# Patient Record
Sex: Female | Born: 1990 | Hispanic: No | Marital: Single | Smoking: Never smoker
Health system: Southern US, Community
[De-identification: ages and names within clinical notes are randomized; demographics above are authoritative.]

## PROBLEM LIST (undated history)

## (undated) HISTORY — PX: ABDOMINAL HYSTERECTOMY: SHX81

## (undated) HISTORY — PX: TUBAL LIGATION: SHX77

---

## 2016-09-29 ENCOUNTER — Other Ambulatory Visit (HOSPITAL_COMMUNITY)
Admission: RE | Admit: 2016-09-29 | Discharge: 2016-09-29 | Disposition: A | Discharge status: Home / Self Care | Payer: Self-pay | Source: Ambulatory Visit | Attending: Unknown Physician Specialty | Admitting: Unknown Physician Specialty

## 2016-09-29 DIAGNOSIS — N870 Mild cervical dysplasia: Secondary | ICD-10-CM | POA: Insufficient documentation

## 2019-02-27 ENCOUNTER — Other Ambulatory Visit: Payer: Self-pay

## 2019-02-27 ENCOUNTER — Ambulatory Visit (INDEPENDENT_AMBULATORY_CARE_PROVIDER_SITE_OTHER): Payer: Medicaid Other | Admitting: Otolaryngology

## 2019-02-27 DIAGNOSIS — D2322 Other benign neoplasm of skin of left ear and external auricular canal: Secondary | ICD-10-CM | POA: Diagnosis not present

## 2019-03-01 ENCOUNTER — Other Ambulatory Visit: Payer: Self-pay | Admitting: Otolaryngology

## 2019-03-14 ENCOUNTER — Other Ambulatory Visit: Payer: Self-pay

## 2019-03-14 ENCOUNTER — Encounter (HOSPITAL_BASED_OUTPATIENT_CLINIC_OR_DEPARTMENT_OTHER): Payer: Self-pay | Admitting: *Deleted

## 2019-03-17 ENCOUNTER — Other Ambulatory Visit (HOSPITAL_COMMUNITY)
Admission: RE | Admit: 2019-03-17 | Discharge: 2019-03-17 | Disposition: A | Discharge status: Home / Self Care | Payer: Medicaid Other | Source: Ambulatory Visit | Attending: Otolaryngology | Admitting: Otolaryngology

## 2019-03-17 ENCOUNTER — Other Ambulatory Visit (HOSPITAL_COMMUNITY): Payer: Medicaid Other

## 2019-03-17 ENCOUNTER — Other Ambulatory Visit: Payer: Self-pay

## 2019-03-17 DIAGNOSIS — Z01812 Encounter for preprocedural laboratory examination: Secondary | ICD-10-CM | POA: Insufficient documentation

## 2019-03-17 DIAGNOSIS — Z20828 Contact with and (suspected) exposure to other viral communicable diseases: Secondary | ICD-10-CM | POA: Diagnosis not present

## 2019-03-17 LAB — SARS CORONAVIRUS 2 (TAT 6-24 HRS): SARS Coronavirus 2: NEGATIVE

## 2019-03-21 ENCOUNTER — Ambulatory Visit (HOSPITAL_BASED_OUTPATIENT_CLINIC_OR_DEPARTMENT_OTHER)
Admission: RE | Admit: 2019-03-21 | Discharge: 2019-03-21 | Disposition: A | Discharge status: Home / Self Care | Payer: Medicaid Other | Attending: Otolaryngology | Admitting: Otolaryngology

## 2019-03-21 ENCOUNTER — Other Ambulatory Visit: Payer: Self-pay

## 2019-03-21 ENCOUNTER — Ambulatory Visit (HOSPITAL_BASED_OUTPATIENT_CLINIC_OR_DEPARTMENT_OTHER): Payer: Medicaid Other | Admitting: Certified Registered"

## 2019-03-21 ENCOUNTER — Encounter (HOSPITAL_BASED_OUTPATIENT_CLINIC_OR_DEPARTMENT_OTHER): Payer: Self-pay

## 2019-03-21 ENCOUNTER — Encounter (HOSPITAL_BASED_OUTPATIENT_CLINIC_OR_DEPARTMENT_OTHER)
Admission: RE | Disposition: A | Discharge status: Home / Self Care | Payer: Self-pay | Source: Home / Self Care | Attending: Otolaryngology

## 2019-03-21 DIAGNOSIS — H938X2 Other specified disorders of left ear: Secondary | ICD-10-CM | POA: Diagnosis present

## 2019-03-21 DIAGNOSIS — G43909 Migraine, unspecified, not intractable, without status migrainosus: Secondary | ICD-10-CM | POA: Insufficient documentation

## 2019-03-21 DIAGNOSIS — D2322 Other benign neoplasm of skin of left ear and external auricular canal: Secondary | ICD-10-CM

## 2019-03-21 DIAGNOSIS — D2222 Melanocytic nevi of left ear and external auricular canal: Secondary | ICD-10-CM | POA: Diagnosis not present

## 2019-03-21 HISTORY — PX: MASS EXCISION: SHX2000

## 2019-03-21 LAB — POCT PREGNANCY, URINE: Preg Test, Ur: NEGATIVE

## 2019-03-21 SURGERY — EXCISION MASS
Anesthesia: General | Site: Ear | Laterality: Left

## 2019-03-21 MED ORDER — CIPROFLOXACIN-FLUOCINOLONE PF 0.3-0.025 % OT SOLN
OTIC | Status: AC
  Filled 2019-03-21: qty 0.25

## 2019-03-21 MED ORDER — HYDROCODONE-ACETAMINOPHEN 5-325 MG PO TABS: 1.0000 | ORAL_TABLET | Freq: Four times a day (QID) | ORAL | 0 refills | Status: AC | PRN

## 2019-03-21 MED ORDER — DEXAMETHASONE SODIUM PHOSPHATE 4 MG/ML IJ SOLN
INTRAMUSCULAR | Status: DC | PRN
  Administered 2019-03-21: 10 mg via INTRAVENOUS

## 2019-03-21 MED ORDER — MIDAZOLAM HCL 2 MG/2ML IJ SOLN
INTRAMUSCULAR | Status: AC
  Filled 2019-03-21: qty 2

## 2019-03-21 MED ORDER — ONDANSETRON HCL 4 MG/2ML IJ SOLN
INTRAMUSCULAR | Status: DC | PRN
  Administered 2019-03-21: 4 mg via INTRAVENOUS

## 2019-03-21 MED ORDER — PROPOFOL 10 MG/ML IV BOLUS
INTRAVENOUS | Status: DC | PRN
  Administered 2019-03-21: 100 mg via INTRAVENOUS
  Administered 2019-03-21: 200 mg via INTRAVENOUS

## 2019-03-21 MED ORDER — LACTATED RINGERS IV SOLN: INTRAVENOUS | Status: DC

## 2019-03-21 MED ORDER — LACTATED RINGERS IV SOLN
INTRAVENOUS | Status: DC
  Administered 2019-03-21: 07:00:00 via INTRAVENOUS

## 2019-03-21 MED ORDER — LIDOCAINE 2% (20 MG/ML) 5 ML SYRINGE
INTRAMUSCULAR | Status: DC | PRN
  Administered 2019-03-21: 60 mg via INTRAVENOUS

## 2019-03-21 MED ORDER — AMOXICILLIN 875 MG PO TABS: 875.0000 mg | ORAL_TABLET | Freq: Two times a day (BID) | ORAL | 0 refills | Status: AC

## 2019-03-21 MED ORDER — LIDOCAINE-EPINEPHRINE 1 %-1:100000 IJ SOLN
INTRAMUSCULAR | Status: AC
  Filled 2019-03-21: qty 2

## 2019-03-21 MED ORDER — PROPOFOL 500 MG/50ML IV EMUL
INTRAVENOUS | Status: AC
  Filled 2019-03-21: qty 50

## 2019-03-21 MED ORDER — LIDOCAINE-EPINEPHRINE 1 %-1:100000 IJ SOLN
INTRAMUSCULAR | Status: DC | PRN
  Administered 2019-03-21: 1 mL

## 2019-03-21 MED ORDER — FENTANYL CITRATE (PF) 100 MCG/2ML IJ SOLN
INTRAMUSCULAR | Status: AC
  Filled 2019-03-21: qty 2

## 2019-03-21 MED ORDER — MEPERIDINE HCL 25 MG/ML IJ SOLN: 6.2500 mg | INTRAMUSCULAR | Status: DC | PRN

## 2019-03-21 MED ORDER — CEFAZOLIN SODIUM-DEXTROSE 2-3 GM-%(50ML) IV SOLR
INTRAVENOUS | Status: DC | PRN
  Administered 2019-03-21: 2 g via INTRAVENOUS

## 2019-03-21 MED ORDER — BACITRACIN ZINC 500 UNIT/GM EX OINT
TOPICAL_OINTMENT | CUTANEOUS | Status: AC
  Filled 2019-03-21: qty 0.9

## 2019-03-21 MED ORDER — EPINEPHRINE PF 1 MG/ML IJ SOLN
INTRAMUSCULAR | Status: AC
  Filled 2019-03-21: qty 1

## 2019-03-21 MED ORDER — MIDAZOLAM HCL 5 MG/5ML IJ SOLN
INTRAMUSCULAR | Status: DC | PRN
  Administered 2019-03-21: 2 mg via INTRAVENOUS

## 2019-03-21 MED ORDER — FENTANYL CITRATE (PF) 100 MCG/2ML IJ SOLN: 25.0000 ug | INTRAMUSCULAR | Status: DC | PRN

## 2019-03-21 MED ORDER — FENTANYL CITRATE (PF) 100 MCG/2ML IJ SOLN
INTRAMUSCULAR | Status: DC | PRN
  Administered 2019-03-21 (×2): 50 ug via INTRAVENOUS

## 2019-03-21 MED ORDER — SUCCINYLCHOLINE CHLORIDE 200 MG/10ML IV SOSY
PREFILLED_SYRINGE | INTRAVENOUS | Status: DC | PRN
  Administered 2019-03-21: 100 mg via INTRAVENOUS

## 2019-03-21 MED ORDER — METOCLOPRAMIDE HCL 5 MG/ML IJ SOLN: 10.0000 mg | Freq: Once | INTRAMUSCULAR | Status: DC | PRN

## 2019-03-21 SURGICAL SUPPLY — 51 items
APPLICATOR COTTON TIP 6 STRL (MISCELLANEOUS) IMPLANT
APPLICATOR COTTON TIP 6IN STRL (MISCELLANEOUS)
BLADE SURG 15 STRL LF DISP TIS (BLADE) ×1 IMPLANT
BLADE SURG 15 STRL SS (BLADE) ×2
CANISTER SUCT 1200ML W/VALVE (MISCELLANEOUS) ×3 IMPLANT
COTTONBALL LRG STERILE PKG (GAUZE/BANDAGES/DRESSINGS) ×3 IMPLANT
COVER BACK TABLE REUSABLE LG (DRAPES) ×3 IMPLANT
COVER MAYO STAND REUSABLE (DRAPES) ×3 IMPLANT
COVER WAND RF STERILE (DRAPES) IMPLANT
DECANTER SPIKE VIAL GLASS SM (MISCELLANEOUS) ×1 IMPLANT
DRAPE HALF SHEET 70X43 (DRAPES) ×3 IMPLANT
DRAPE IMP U-DRAPE 54X76 (DRAPES) ×3 IMPLANT
DRAPE MICROSCOPE WILD 40.5X102 (DRAPES) ×1 IMPLANT
ELECT COATED BLADE 2.86 ST (ELECTRODE) IMPLANT
ELECT NDL BLADE 2-5/6 (NEEDLE) ×1 IMPLANT
ELECT NEEDLE BLADE 2-5/6 (NEEDLE) ×3 IMPLANT
ELECT REM PT RETURN 9FT ADLT (ELECTROSURGICAL) ×3
ELECTRODE REM PT RTRN 9FT ADLT (ELECTROSURGICAL) ×1 IMPLANT
GAUZE SPONGE 4X4 12PLY STRL LF (GAUZE/BANDAGES/DRESSINGS) ×1 IMPLANT
GAUZE XEROFORM 1X8 LF (GAUZE/BANDAGES/DRESSINGS) ×2 IMPLANT
GLOVE BIO SURGEON STRL SZ 6.5 (GLOVE) ×2 IMPLANT
GLOVE BIO SURGEON STRL SZ7.5 (GLOVE) ×3 IMPLANT
GLOVE BIO SURGEONS STRL SZ 6.5 (GLOVE) ×2
GLOVE BIOGEL PI IND STRL 6.5 (GLOVE) IMPLANT
GLOVE BIOGEL PI IND STRL 7.0 (GLOVE) IMPLANT
GLOVE BIOGEL PI INDICATOR 6.5 (GLOVE) ×2
GLOVE BIOGEL PI INDICATOR 7.0 (GLOVE) ×2
GOWN STRL REUS W/ TWL LRG LVL3 (GOWN DISPOSABLE) ×1 IMPLANT
GOWN STRL REUS W/ TWL XL LVL3 (GOWN DISPOSABLE) IMPLANT
GOWN STRL REUS W/TWL LRG LVL3 (GOWN DISPOSABLE) ×4
GOWN STRL REUS W/TWL XL LVL3 (GOWN DISPOSABLE) ×2
IV SET EXT 30 76VOL 4 MALE LL (IV SETS) IMPLANT
NDL BLUNT 17GA (NEEDLE) ×1 IMPLANT
NDL PRECISIONGLIDE 27X1.5 (NEEDLE) ×1 IMPLANT
NEEDLE BLUNT 17GA (NEEDLE) IMPLANT
NEEDLE PRECISIONGLIDE 27X1.5 (NEEDLE) ×3 IMPLANT
NS IRRIG 1000ML POUR BTL (IV SOLUTION) ×2 IMPLANT
PACK BASIN DAY SURGERY FS (CUSTOM PROCEDURE TRAY) ×3 IMPLANT
PENCIL BUTTON HOLSTER BLD 10FT (ELECTRODE) ×3 IMPLANT
SPONGE SURGIFOAM ABS GEL 12-7 (HEMOSTASIS) IMPLANT
SUT CHROMIC 4 0 P 3 18 (SUTURE) IMPLANT
SUT PLAIN 5 0 P 3 18 (SUTURE) IMPLANT
SUT VIC AB 4-0 P-3 18XBRD (SUTURE) IMPLANT
SUT VIC AB 4-0 P3 18 (SUTURE) ×2
SUT VICRYL 4-0 PS2 18IN ABS (SUTURE) IMPLANT
SWABSTICK POVIDONE IODINE SNGL (MISCELLANEOUS) ×2 IMPLANT
SYR CONTROL 10ML LL (SYRINGE) ×3 IMPLANT
TOWEL GREEN STERILE FF (TOWEL DISPOSABLE) ×3 IMPLANT
TRAY DSU PREP LF (CUSTOM PROCEDURE TRAY) IMPLANT
TUBE CONNECTING 20'X1/4 (TUBING) ×1
TUBE CONNECTING 20X1/4 (TUBING) ×2 IMPLANT

## 2019-03-21 NOTE — Anesthesia Procedure Notes (Signed)
Procedure Name: LMA Insertion Date/Time: 03/21/2019 7:36 AM Performed by: Lieutenant Diego, CRNA Pre-anesthesia Checklist: Patient identified, Emergency Drugs available, Patient being monitored and Suction available Patient Re-evaluated:Patient Re-evaluated prior to induction Oxygen Delivery Method: Circle system utilized Preoxygenation: Pre-oxygenation with 100% oxygen Induction Type: IV induction Ventilation: Mask ventilation without difficulty LMA: LMA inserted LMA Size: 4.0 Number of attempts: 1 Placement Confirmation: positive ETCO2 and breath sounds checked- equal and bilateral Tube secured with: Tape Dental Injury: Teeth and Oropharynx as per pre-operative assessment

## 2019-03-21 NOTE — Transfer of Care (Signed)
Immediate Anesthesia Transfer of Care Note  Patient: Pamela Ellison  Procedure(s) Performed: EXCISION LEFT EAR CANAL MASS (Left Ear)  Patient Location: PACU  Anesthesia Type:General  Level of Consciousness: drowsy  Airway & Oxygen Therapy: Patient Spontanous Breathing and Patient connected to face mask oxygen  Post-op Assessment: Report given to RN and Post -op Vital signs reviewed and stable  Post vital signs: Reviewed and stable  Last Vitals:  Vitals Value Taken Time  BP    Temp    Pulse 105 03/21/19 0802  Resp 19 03/21/19 0802  SpO2 100 % 03/21/19 0802  Vitals shown include unvalidated device data.  Last Pain:  Vitals:   03/21/19 0704  TempSrc: Oral  PainSc: 0-No pain         Complications: No apparent anesthesia complications

## 2019-03-21 NOTE — Anesthesia Procedure Notes (Signed)
Procedure Name: Intubation Date/Time: 03/21/2019 7:41 AM Performed by: Lieutenant Diego, CRNA Pre-anesthesia Checklist: Patient identified, Emergency Drugs available, Suction available and Patient being monitored Patient Re-evaluated:Patient Re-evaluated prior to induction Oxygen Delivery Method: Circle system utilized Preoxygenation: Pre-oxygenation with 100% oxygen Induction Type: IV induction Ventilation: Mask ventilation without difficulty Laryngoscope Size: Miller and 2 Grade View: Grade I Tube type: Oral Tube size: 7.0 mm Number of attempts: 1 Airway Equipment and Method: Stylet and Oral airway Placement Confirmation: ETT inserted through vocal cords under direct vision,  positive ETCO2 and breath sounds checked- equal and bilateral Secured at: 22 cm Tube secured with: Tape Dental Injury: Teeth and Oropharynx as per pre-operative assessment

## 2019-03-21 NOTE — Anesthesia Preprocedure Evaluation (Signed)
Anesthesia Evaluation  Patient identified by MRN, date of birth, ID band Patient awake    Reviewed: Allergy & Precautions, NPO status , Patient's Chart, lab work & pertinent test results  Airway Mallampati: II  TM Distance: >3 FB Neck ROM: Full    Dental no notable dental hx.    Pulmonary neg pulmonary ROS,    Pulmonary exam normal breath sounds clear to auscultation       Cardiovascular negative cardio ROS Normal cardiovascular exam Rhythm:Regular Rate:Normal     Neuro/Psych negative neurological ROS  negative psych ROS   GI/Hepatic negative GI ROS, Neg liver ROS,   Endo/Other  negative endocrine ROS  Renal/GU negative Renal ROS  negative genitourinary   Musculoskeletal negative musculoskeletal ROS (+)   Abdominal   Peds negative pediatric ROS (+)  Hematology negative hematology ROS (+)   Anesthesia Other Findings   Reproductive/Obstetrics negative OB ROS                             Anesthesia Physical Anesthesia Plan  ASA: I  Anesthesia Plan: General   Post-op Pain Management:    Induction: Intravenous  PONV Risk Score and Plan: 3 and Ondansetron, Dexamethasone and Treatment may vary due to age or medical condition  Airway Management Planned: LMA  Additional Equipment:   Intra-op Plan:   Post-operative Plan: Extubation in OR  Informed Consent: I have reviewed the patients History and Physical, chart, labs and discussed the procedure including the risks, benefits and alternatives for the proposed anesthesia with the patient or authorized representative who has indicated his/her understanding and acceptance.     Dental advisory given  Plan Discussed with: CRNA  Anesthesia Plan Comments:         Anesthesia Quick Evaluation

## 2019-03-21 NOTE — H&P (Signed)
Cc: Left ear canal mass  HPI: The patient is a 28 y/o female who presents today for evaluation of a growth in her left ear canal. The patient is seen in consultation requested by Dr. Consuello Masse. The patient noted a small bump in her left ear canal one year ago. The area has gotten larger and sometimes obstructs her ear canal. The patient denies otalgia, otorrhea, or hearing loss. She has no history of otologic surgery or trauma.   The patient's review of systems (constitutional, eyes, ENT, cardiovascular, respiratory, GI, musculoskeletal, skin, neurologic, psychiatric, endocrine, hematologic, allergic) is noted in the ROS questionnaire.  It is reviewed with the patient.   Family health history: No HTN, CAD, DM, hearing loss or bleeding disorder.  Major events: None.  Ongoing medical problems: Night sweats, migraine.  Social history: The patient is single. She denies the use of tobacco, alcohol or illegal drugs.   Exdam: General: Communicates without difficulty, well nourished, no acute distress. Head: Normocephalic, no evidence injury, no tenderness, facial buttresses intact without stepoff. Eyes: PERRL, EOMI. No scleral icterus, conjunctivae clear. Neuro: CN II exam reveals vision grossly intact.  No nystagmus at any point of gaze. Ears: Auricles well formed without lesions.  Ear canals are intact with a verrucous growth noted at the entrance to the left ear canal.   No erythema or edema is appreciated.  The TMs are intact without fluid. Nose: External evaluation reveals normal support and skin without lesions.  Dorsum is intact.  Anterior rhinoscopy reveals healthy pink mucosa over anterior aspect of inferior turbinates and intact septum.  No purulence noted. Oral:  Oral cavity and oropharynx are intact, symmetric, without erythema or edema.  Mucosa is moist without lesions. Neck: Full range of motion without pain.  There is no significant lymphadenopathy.  No masses palpable.  Thyroid bed within  normal limits to palpation.  Parotid glands and submandibular glands equal bilaterally without mass.  Trachea is midline. Neuro:  CN 2-12 grossly intact. Gait normal. Vestibular: No nystagmus at any point of gaze. The cerebellar examination is unremarkable.   Assessment 1. A 1cm left ear canal lesion, with the appearance consistent with a papilloma.   Plan  1. The physical exam findings are reviewed with the patient.  2. In light of the size of the lesion, recommend excision. The risks, benefits, alternatives, and details of the procedure are reviewed with the patient. Questions are invited and answered. 3. The patient is interested in proceeding with the procedure.  We will schedule the procedure in accordance with the family schedule.

## 2019-03-21 NOTE — Discharge Instructions (Addendum)
°  Post Anesthesia Home Care Instructions  Activity: Get plenty of rest for the remainder of the day. A responsible individual must stay with you for 24 hours following the procedure.  For the next 24 hours, DO NOT: -Drive a car -Paediatric nurse -Drink alcoholic beverages -Take any medication unless instructed by your physician -Make any legal decisions or sign important papers.  Meals: Start with liquid foods such as gelatin or soup. Progress to regular foods as tolerated. Avoid greasy, spicy, heavy foods. If nausea and/or vomiting occur, drink only clear liquids until the nausea and/or vomiting subsides. Call your physician if vomiting continues.   Special Instructions/Symptoms: Your throat may feel dry or sore from the anesthesia or the breathing tube placed in your throat during surgery. If this causes discomfort, gargle with warm salt water. The discomfort should disappear within 24 hours.  If you had a scopolamine patch placed behind your ear for the management of post- operative nausea and/or vomiting:  1. The medication in the patch is effective for 72 hours, after which it should be removed.  Wrap patch in a tissue and discard in the trash. Wash hands thoroughly with soap and water. 2. You may remove the patch earlier than 72 hours if you experience unpleasant side effects which may include dry mouth, dizziness or visual disturbances. 3. Avoid touching the patch. Wash your hands with soap and water after contact with the patch.    The patient may resume all her previous activities and diet.  She should leave the bolster dressing on her left ear intact until her follow-up visit in 1 week.

## 2019-03-21 NOTE — Op Note (Signed)
DATE OF PROCEDURE:  03/21/2019                              OPERATIVE REPORT  SURGEON:  Leta Baptist, MD  PREOPERATIVE DIAGNOSES: 1. Left ear canal mass.  POSTOPERATIVE DIAGNOSES: 1. Left ear canal mass.  PROCEDURE PERFORMED:  Excision of left ear canal mass.  ANESTHESIA:  General endotracheal tube anesthesia.  COMPLICATIONS:  None.  ESTIMATED BLOOD LOSS:  Minimal.  INDICATION FOR PROCEDURE:  Pamela Ellison is a 28 y.o. female with a history of an enlarging left ear canal mass.  The appearance was suggestive of a papilloma. Based on the above findings, the decision was made for the patient to undergo the excision procedure.   The risks, benefits, alternatives, and details of the procedure were discussed with the patient.  Questions were invited and answered.  Informed consent was obtained.  DESCRIPTION:  The patient was taken to the operating room and placed supine on the operating table.  General endotracheal tube anesthesia was administered by the anesthesiologist.  The patient was positioned and prepped and draped in a standard fashion for left ear surgery.   Examination of the left ear revealed a 1 cm lesion at the entrance to the left ear canal with.  1% lidocaine with 1-100,000 epinephrine was infiltrated around the lesion.  A circular incision was made around the lesion using a #15 blade.  The entire mass was excised, together with the subcutaneous tissue.  The specimen was sent to the pathology department for permanent histologic identification.  Hemostasis was achieved with a Bovie electrocautery device.  A Xeroform bolster dressing was applied.  It was sutured in place with 4-0 Vicryl sutures.  The care of the patient was turned over to the anesthesiologist.  The patient was awakened from anesthesia without difficulty.  The patient was extubated and transferred to the recovery room in good condition.  OPERATIVE FINDINGS: A 1 cm soft tissue mass was noted at the entrance to the left  ear canal.  SPECIMEN: Left ear canal mass.  FOLLOWUP CARE:  The patient will be discharged home once awake and alert.  She will be placed on amoxicillin 875 mg p.o. b.i.d. for 5 days, and Vicodin PRN pain.  The patient will follow up in my office in approximately 1 week.  Pennye Beeghly W Koula Venier 03/21/2019 7:59 AM

## 2019-03-21 NOTE — Anesthesia Postprocedure Evaluation (Signed)
Anesthesia Post Note  Patient: Monice Mirante  Procedure(s) Performed: EXCISION LEFT EAR CANAL MASS (Left Ear)     Patient location during evaluation: PACU Anesthesia Type: General Level of consciousness: awake and alert Pain management: pain level controlled Vital Signs Assessment: post-procedure vital signs reviewed and stable Respiratory status: spontaneous breathing, nonlabored ventilation, respiratory function stable and patient connected to nasal cannula oxygen Cardiovascular status: blood pressure returned to baseline and stable Postop Assessment: no apparent nausea or vomiting Anesthetic complications: no    Last Vitals:  Vitals:   03/21/19 0815 03/21/19 0830  BP: 117/76 124/78  Pulse: (!) 102 (!) 101  Resp: 20 20  Temp:  37.2 C  SpO2: 98% 99%    Last Pain:  Vitals:   03/21/19 0830  TempSrc:   PainSc: 0-No pain                 Montez Hageman

## 2019-03-22 ENCOUNTER — Encounter (HOSPITAL_BASED_OUTPATIENT_CLINIC_OR_DEPARTMENT_OTHER): Payer: Self-pay | Admitting: Otolaryngology

## 2019-03-22 LAB — SURGICAL PATHOLOGY

## 2019-03-30 ENCOUNTER — Ambulatory Visit (INDEPENDENT_AMBULATORY_CARE_PROVIDER_SITE_OTHER): Payer: Medicaid Other | Admitting: Otolaryngology

## 2021-10-15 ENCOUNTER — Emergency Department (HOSPITAL_COMMUNITY): Payer: Medicaid Other

## 2021-10-15 ENCOUNTER — Other Ambulatory Visit: Payer: Self-pay

## 2021-10-15 ENCOUNTER — Emergency Department (HOSPITAL_COMMUNITY)
Admission: EM | Admit: 2021-10-15 | Discharge: 2021-10-15 | Disposition: A | Discharge status: Home / Self Care | Payer: Medicaid Other | Attending: Emergency Medicine | Admitting: Emergency Medicine

## 2021-10-15 ENCOUNTER — Encounter (HOSPITAL_COMMUNITY): Payer: Self-pay

## 2021-10-15 DIAGNOSIS — Z7982 Long term (current) use of aspirin: Secondary | ICD-10-CM | POA: Diagnosis not present

## 2021-10-15 DIAGNOSIS — S0990XA Unspecified injury of head, initial encounter: Secondary | ICD-10-CM | POA: Diagnosis not present

## 2021-10-15 DIAGNOSIS — S199XXA Unspecified injury of neck, initial encounter: Secondary | ICD-10-CM | POA: Diagnosis present

## 2021-10-15 DIAGNOSIS — M25512 Pain in left shoulder: Secondary | ICD-10-CM | POA: Insufficient documentation

## 2021-10-15 DIAGNOSIS — S161XXA Strain of muscle, fascia and tendon at neck level, initial encounter: Secondary | ICD-10-CM | POA: Diagnosis not present

## 2021-10-15 DIAGNOSIS — Y92410 Unspecified street and highway as the place of occurrence of the external cause: Secondary | ICD-10-CM | POA: Insufficient documentation
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
## 2021-10-28 ENCOUNTER — Telehealth: Payer: Self-pay | Admitting: Orthopedic Surgery
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
## 2021-11-05 ENCOUNTER — Ambulatory Visit: Payer: Medicaid Other | Admitting: Orthopedic Surgery

## 2022-02-17 ENCOUNTER — Encounter (HOSPITAL_COMMUNITY): Payer: Self-pay | Admitting: Physical Therapy

## 2022-02-17 ENCOUNTER — Ambulatory Visit (HOSPITAL_COMMUNITY): Payer: Medicaid Other | Attending: Physician Assistant | Admitting: Physical Therapy

## 2023-12-28 ENCOUNTER — Ambulatory Visit (HOSPITAL_COMMUNITY): Payer: Self-pay | Admitting: Clinical

## 2024-01-27 IMAGING — DX DG SHOULDER 2+V*L*
2 series · 2 of 2 positions shown · non-contrast
Comparison: None Available.

CLINICAL DATA: Left shoulder pain

EXAM:
LEFT SHOULDER - 2+ VIEW

[shoulder grashey]
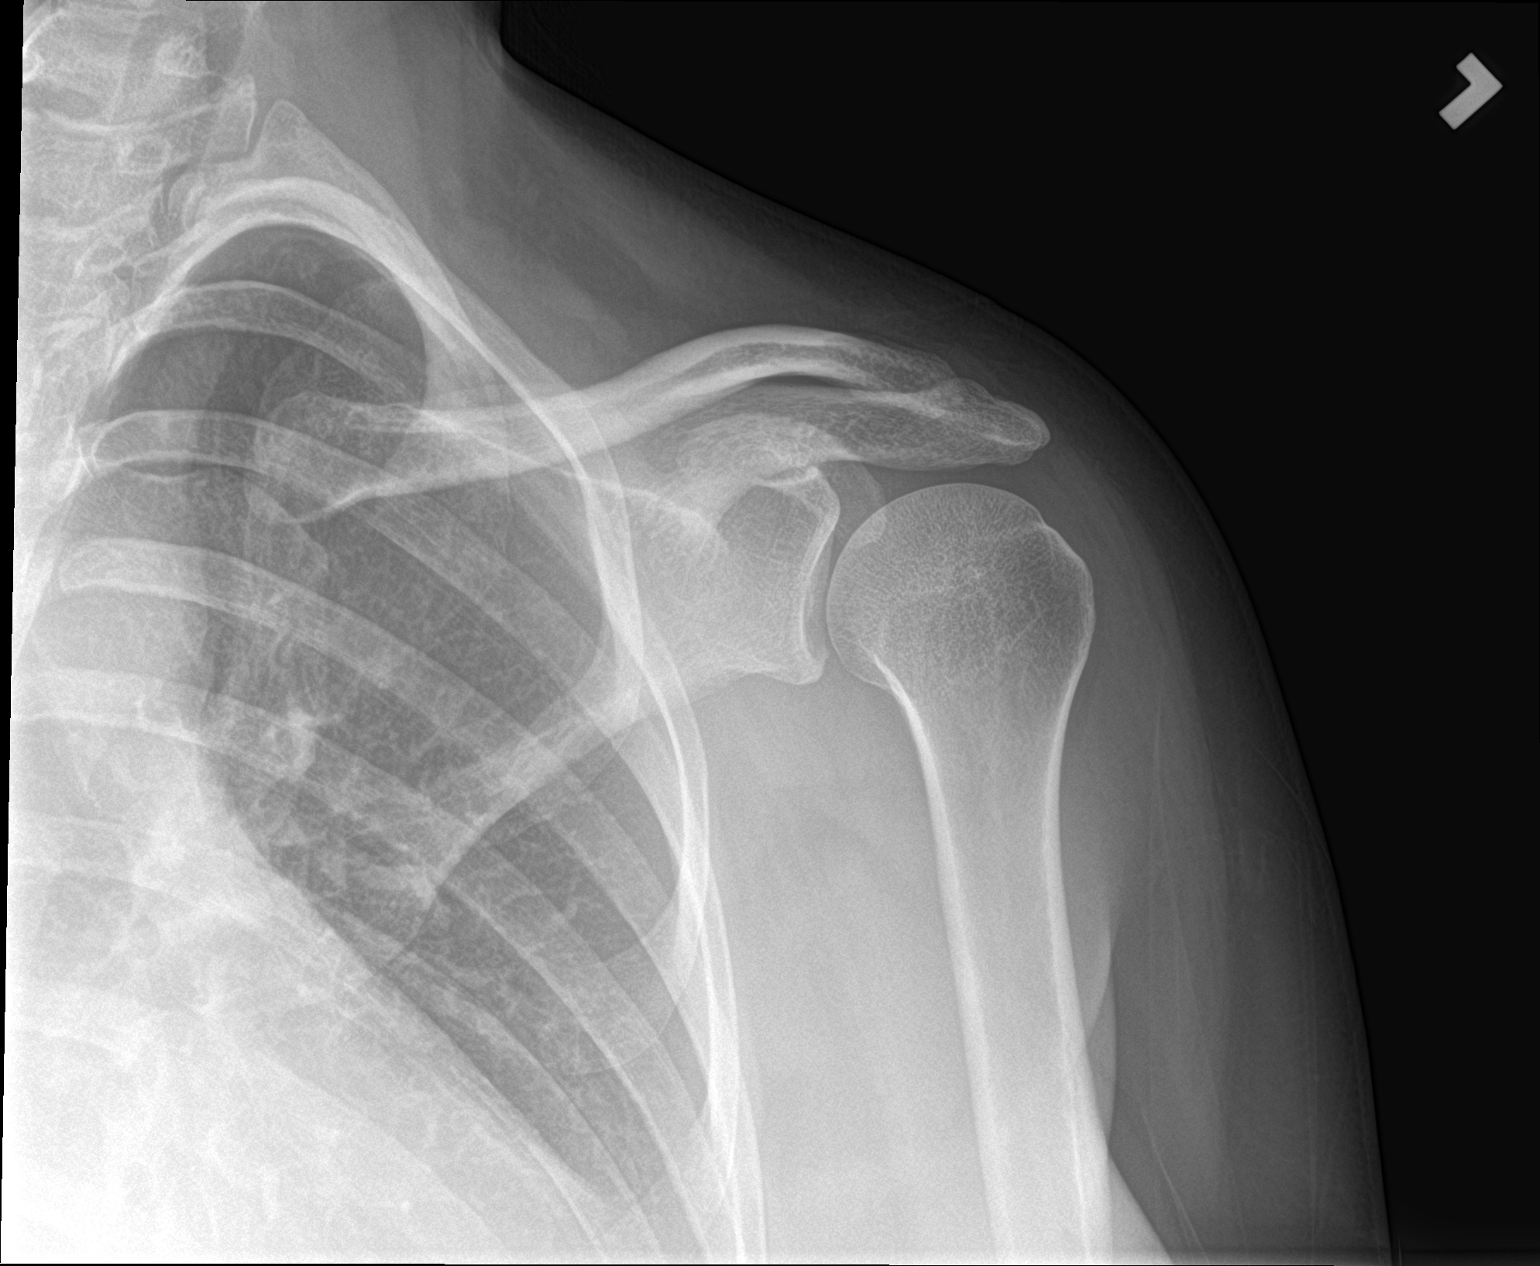

[shoulder y view]
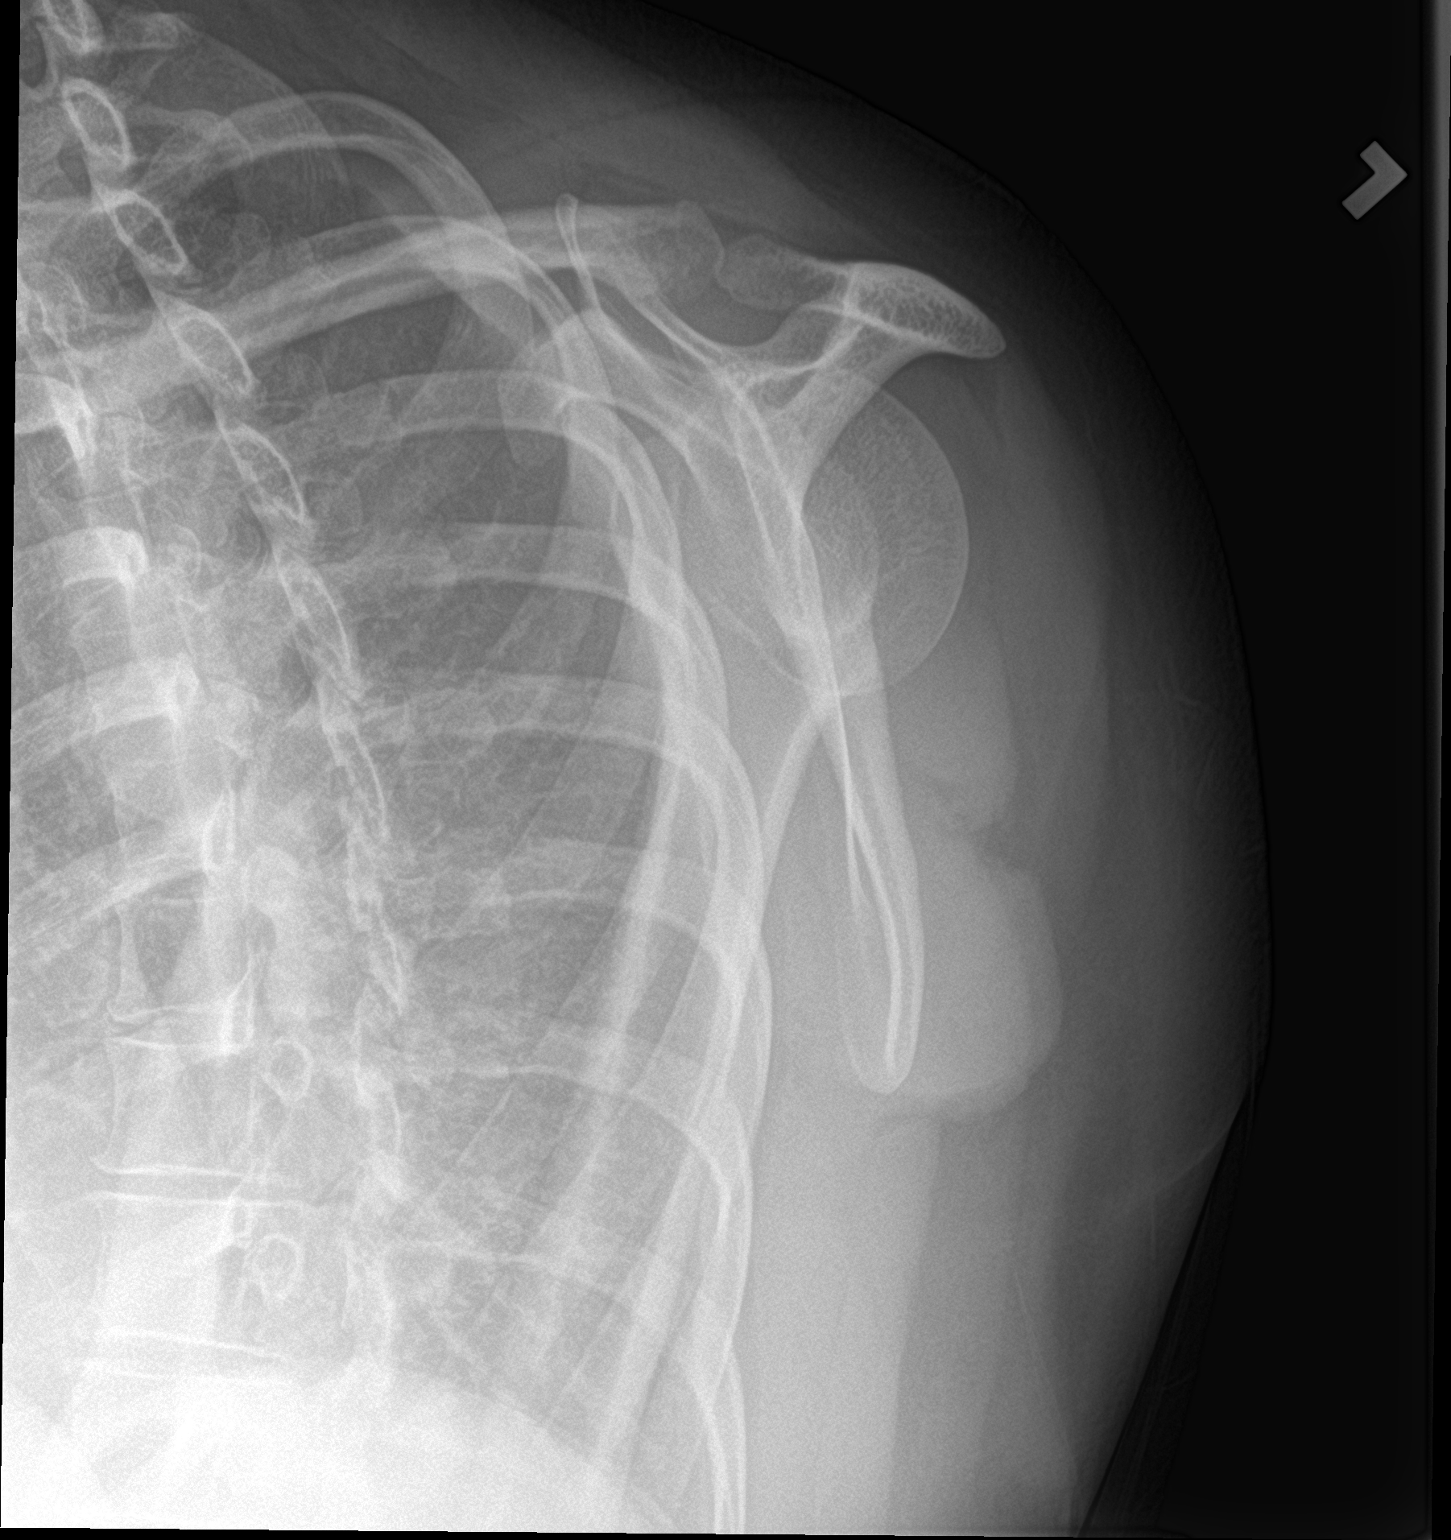

[2 of 2 positions shown; findings below may reference images not displayed]

FINDINGS: There is no evidence of acute fracture. Alignment is normal. Soft
tissues appear unremarkable.
IMPRESSION: Negative left shoulder radiographs.
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
## 2024-04-13 ENCOUNTER — Ambulatory Visit (HOSPITAL_COMMUNITY): Payer: Self-pay | Admitting: Registered Nurse
